# Patient Record
Sex: Male | Born: 1975 | Race: White | Hispanic: No | Marital: Married | Smoking: Current every day smoker
Health system: Southern US, Community
[De-identification: ages and names within clinical notes are randomized; demographics above are authoritative.]

## PROBLEM LIST (undated history)

## (undated) DIAGNOSIS — K5792 Diverticulitis of intestine, part unspecified, without perforation or abscess without bleeding: Secondary | ICD-10-CM

## (undated) HISTORY — PX: COLON SURGERY: SHX602

## (undated) HISTORY — PX: TONSILLECTOMY: SUR1361

---

## 2006-03-23 ENCOUNTER — Emergency Department (HOSPITAL_COMMUNITY): Admission: EM | Admit: 2006-03-23 | Discharge: 2006-03-23 | Payer: Self-pay | Admitting: Emergency Medicine

## 2007-12-31 IMAGING — CT CT PELVIS W/O CM
2 of 3 series · 17 of 46 positions shown, 19 images · IV contrast (agent unspecified)
Comparison: None.

CLINICAL DATA: 30-year-old, with left flank and left lower quadrant pain for 3 weeks.  
ABDOMEN CT WITHOUT CONTRAST:
TECHNIQUE: Multidetector CT imaging of the abdomen was performed following the standard protocol without IV contrast.
TECHNIQUE: Multidetector CT imaging of the pelvis was performed following the standard protocol without IV contrast.

[Series 2: renal stone · axial · 0.76mm/px · z∈[-468,-58]mm · 14 of 96 slices shown, 16 images]
[im 7/96  soft-tissue]
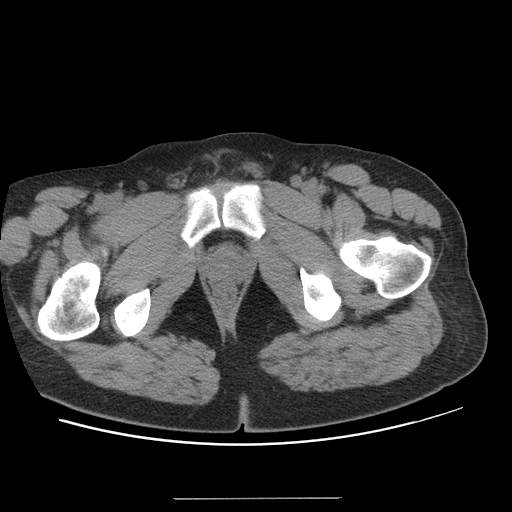
[im 7/96  bone]
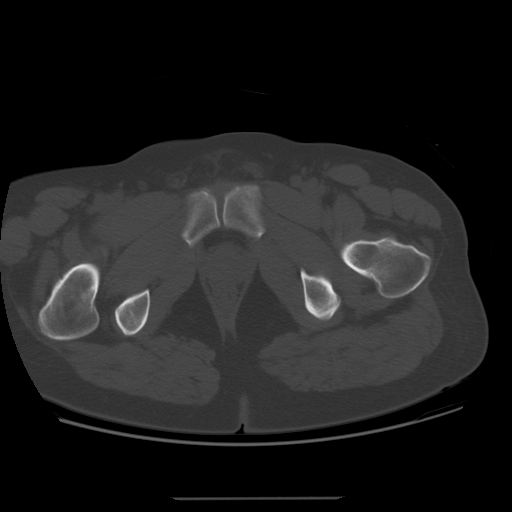
[im 13/96  soft-tissue]
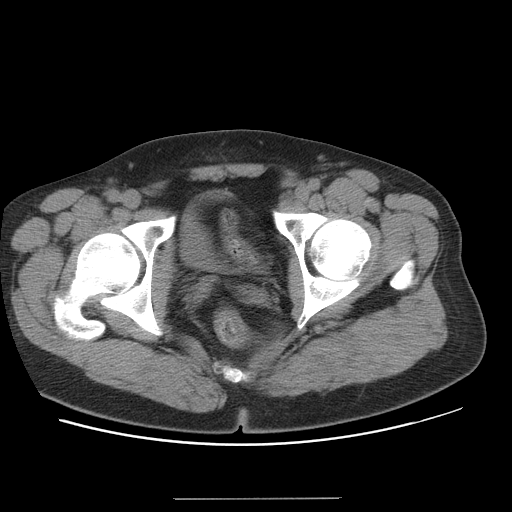
[im 19/96  soft-tissue]
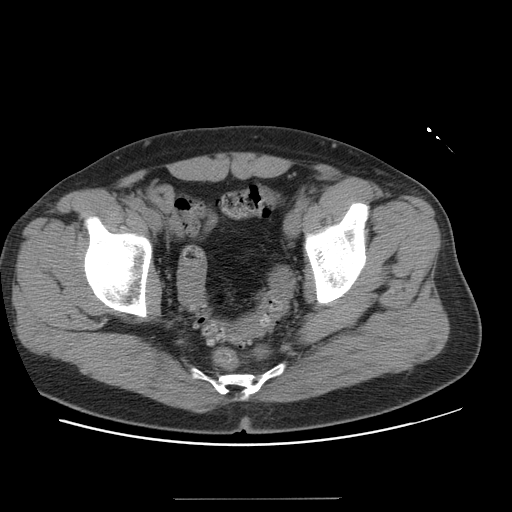
[im 25/96  soft-tissue]
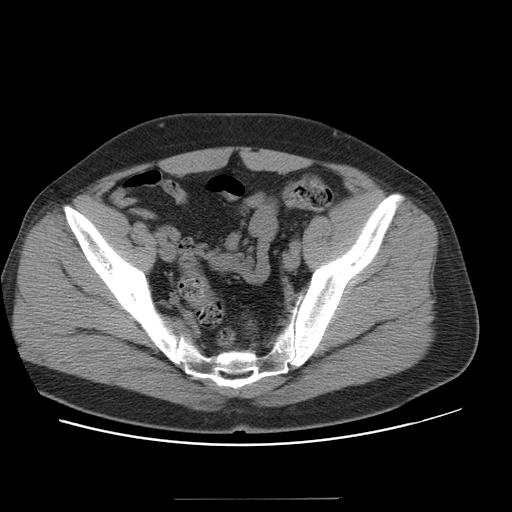
[im 31/96  soft-tissue]
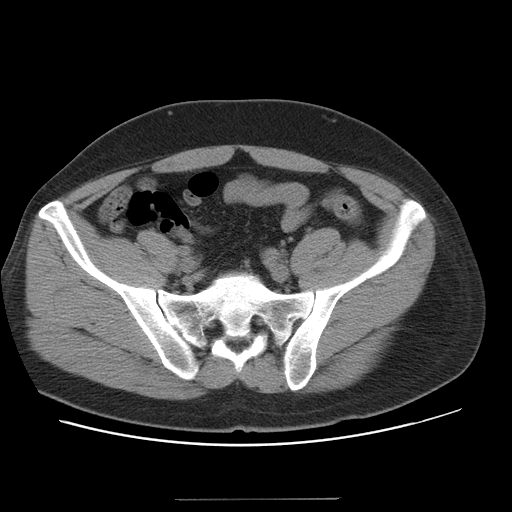
[im 37/96  soft-tissue]
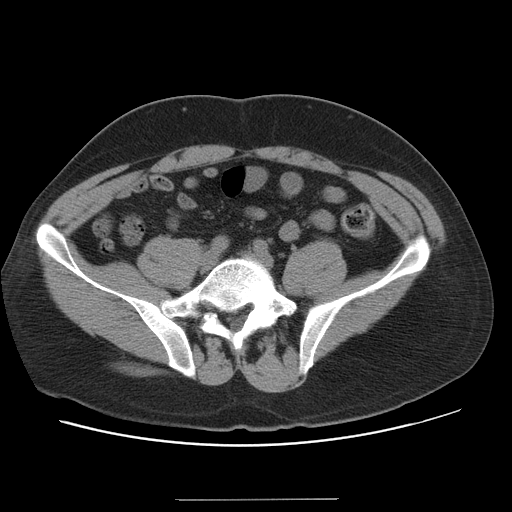
[im 43/96  soft-tissue]
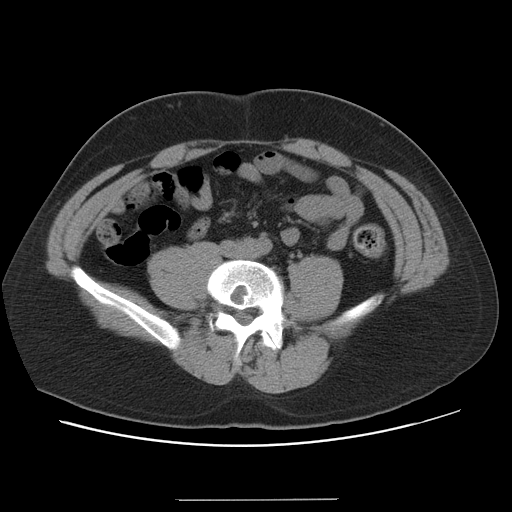
[im 53/96  soft-tissue]
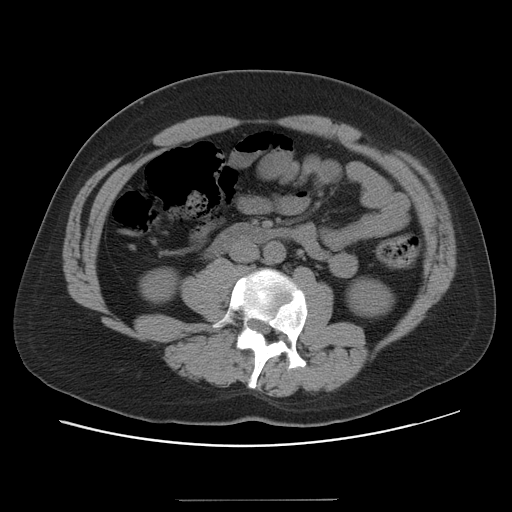
[im 59/96  soft-tissue]
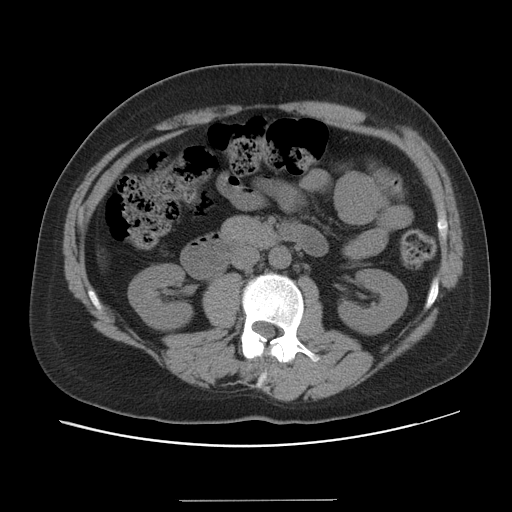
[im 59/96  bone]
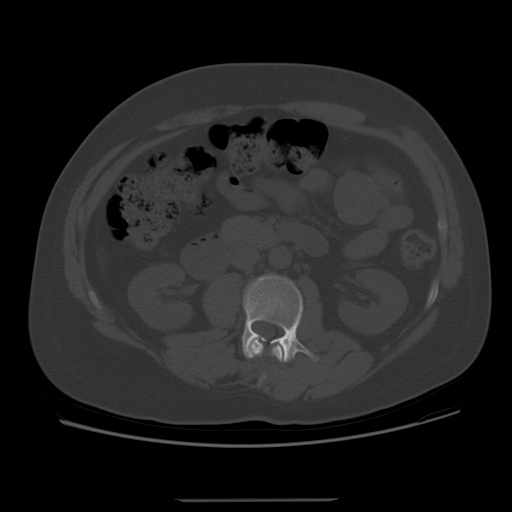
[im 65/96  soft-tissue]
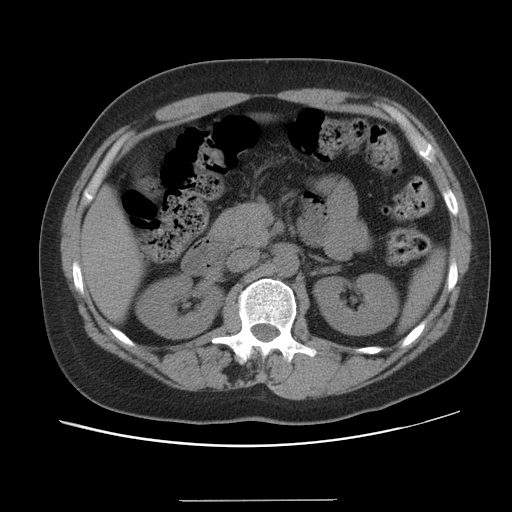
[im 71/96  soft-tissue]
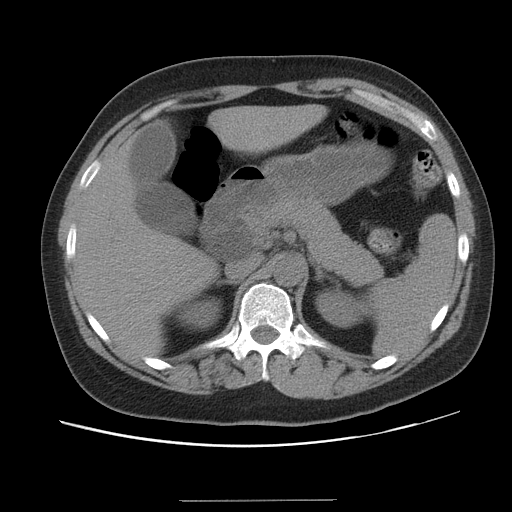
[im 77/96  soft-tissue]
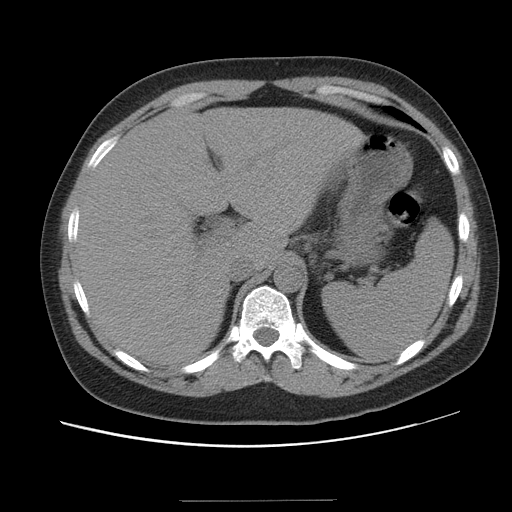
[im 83/96  soft-tissue]
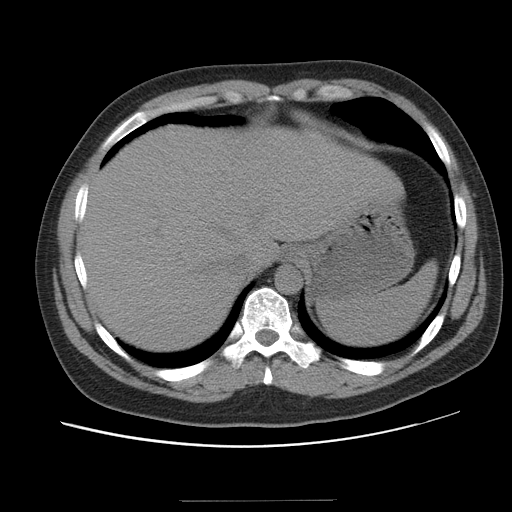
[im 89/96  soft-tissue]
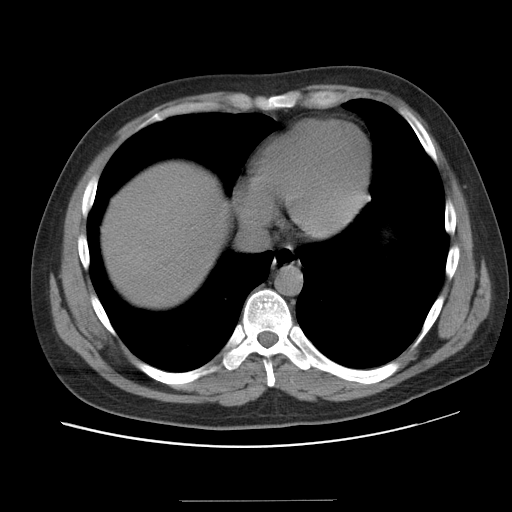

[Series 300: coronal abd · coronal · 0.96mm/px · 3 of 53 slices shown]
[im 18/53  soft-tissue]
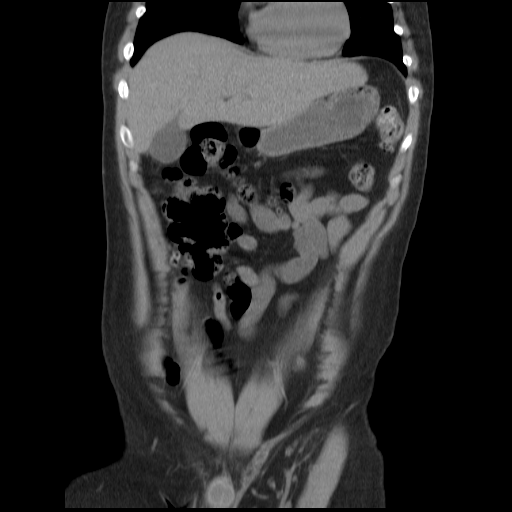
[im 24/53  soft-tissue]
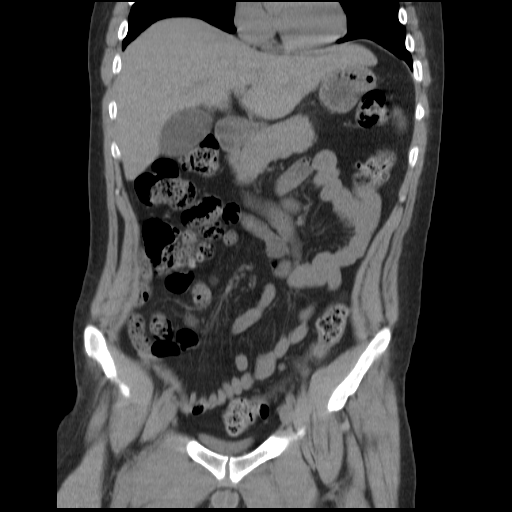
[im 29/53  soft-tissue]
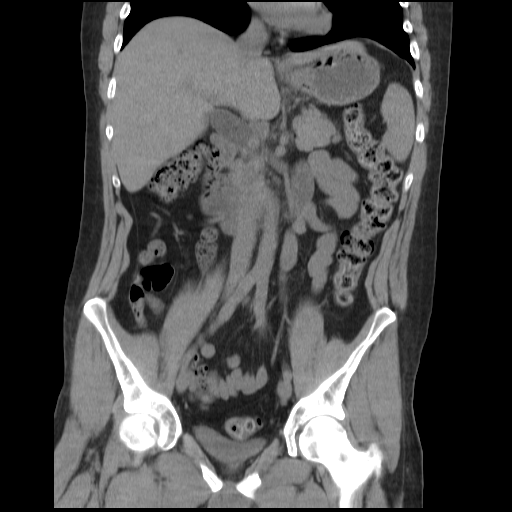

[17 of 46 positions shown; findings below may reference images not displayed]

FINDINGS: Lung bases are clear.  Negative for free air.  Evaluation of the intraabdominal structures is inherently limited without intravenous contrast.  The liver, gallbladder, spleen, pancreas, adrenal glands, stomach, duodenum, and kidneys are within normal limits.  Negative for nephrolithiasis or hydronephrosis.  At least two small accessory spleens are present.  No significant free fluid or lymphadenopathy.  Abnormal appearance of the lumbar spine demonstrated by levoscoliosis.  The patient appears to have a hemivertebra probably involving L-3.  In addition, there is a well circumscribed low density lesion with peripheral sclerosis involving the body of L-4 with probable connection to the superior end plate.  This could represent a large Schmorl?s node although it is indeterminate.
IMPRESSION: 1.  No acute findings in the abdomen.  Negative for hydronephrosis or nephrolithiasis. 
2.  Scoliosis of the lumbar spine with probable hemivertebra and an indeterminate lucent lesion probably involving L-4.  This could represent a very large Schmorl?s node. 
PELVIS CT WITHOUT CONTRAST:
FINDINGS: There are scattered diverticula throughout the sigmoid colon.  It is difficult to exclude a small amount of colonic wall thickening but there is no surrounding inflammation.  No significant free fluid or lymphadenopathy.  The urinary bladder, prostate, and seminal vesicles are within normal limits.  No acute bone abnormalities.
IMPRESSION: Diverticulosis, particularly in the sigmoid colon.  No definite acute inflammatory changes.

## 2009-04-30 ENCOUNTER — Emergency Department (HOSPITAL_COMMUNITY): Admission: EM | Admit: 2009-04-30 | Discharge: 2009-04-30 | Payer: Self-pay | Admitting: Emergency Medicine

## 2011-02-07 IMAGING — CR DG RIBS W/ CHEST 3+V*R*
3 series · 3 of 3 positions shown · non-contrast
Comparison: None.

CLINICAL DATA: Right-sided rib pain, possible lifting injury.

RIGHT RIBS AND CHEST - 3+ VIEW

[w ribs ap/pa upper right]
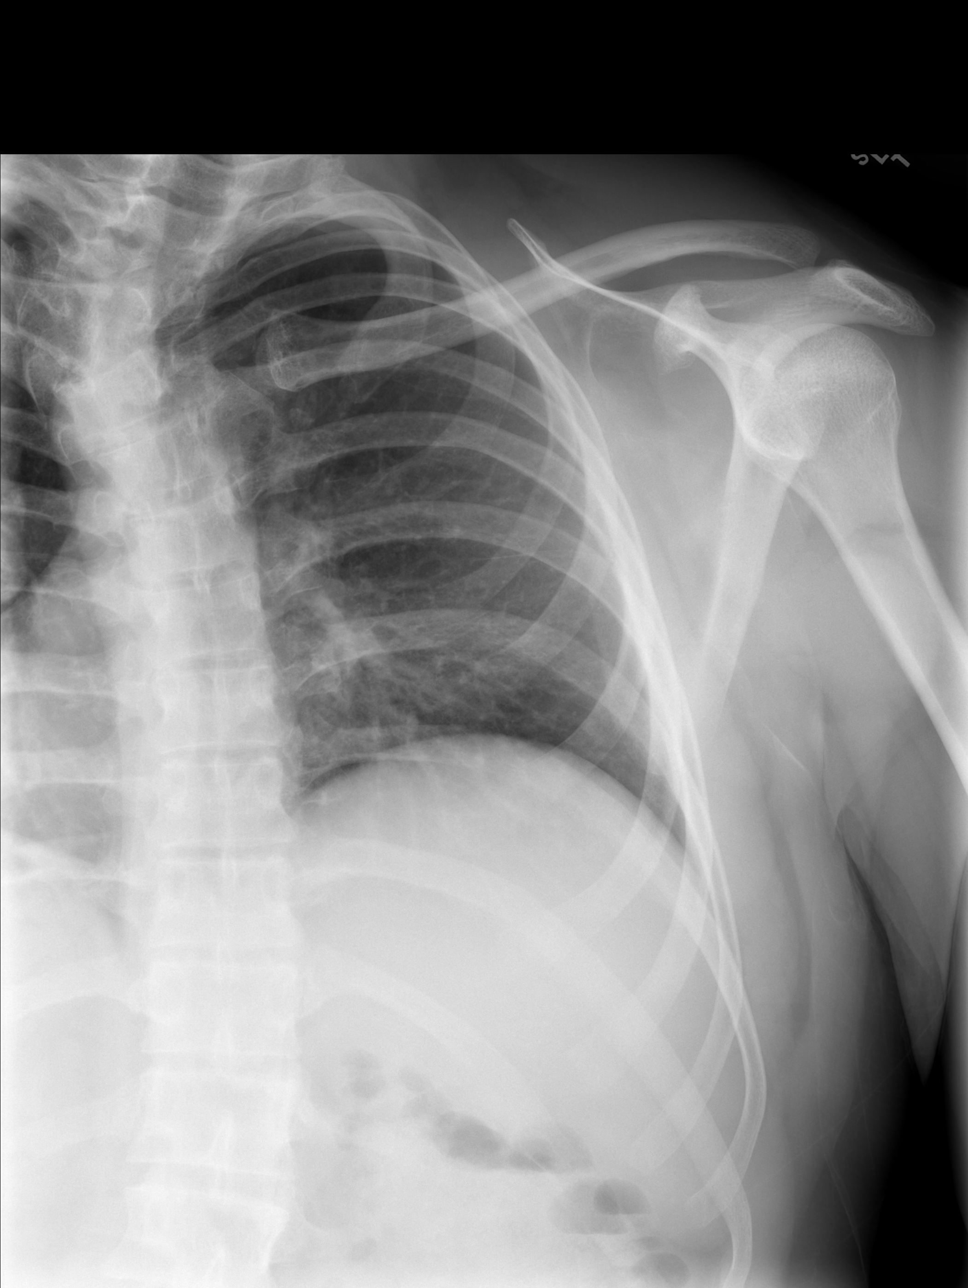

[w ribs oblique right]
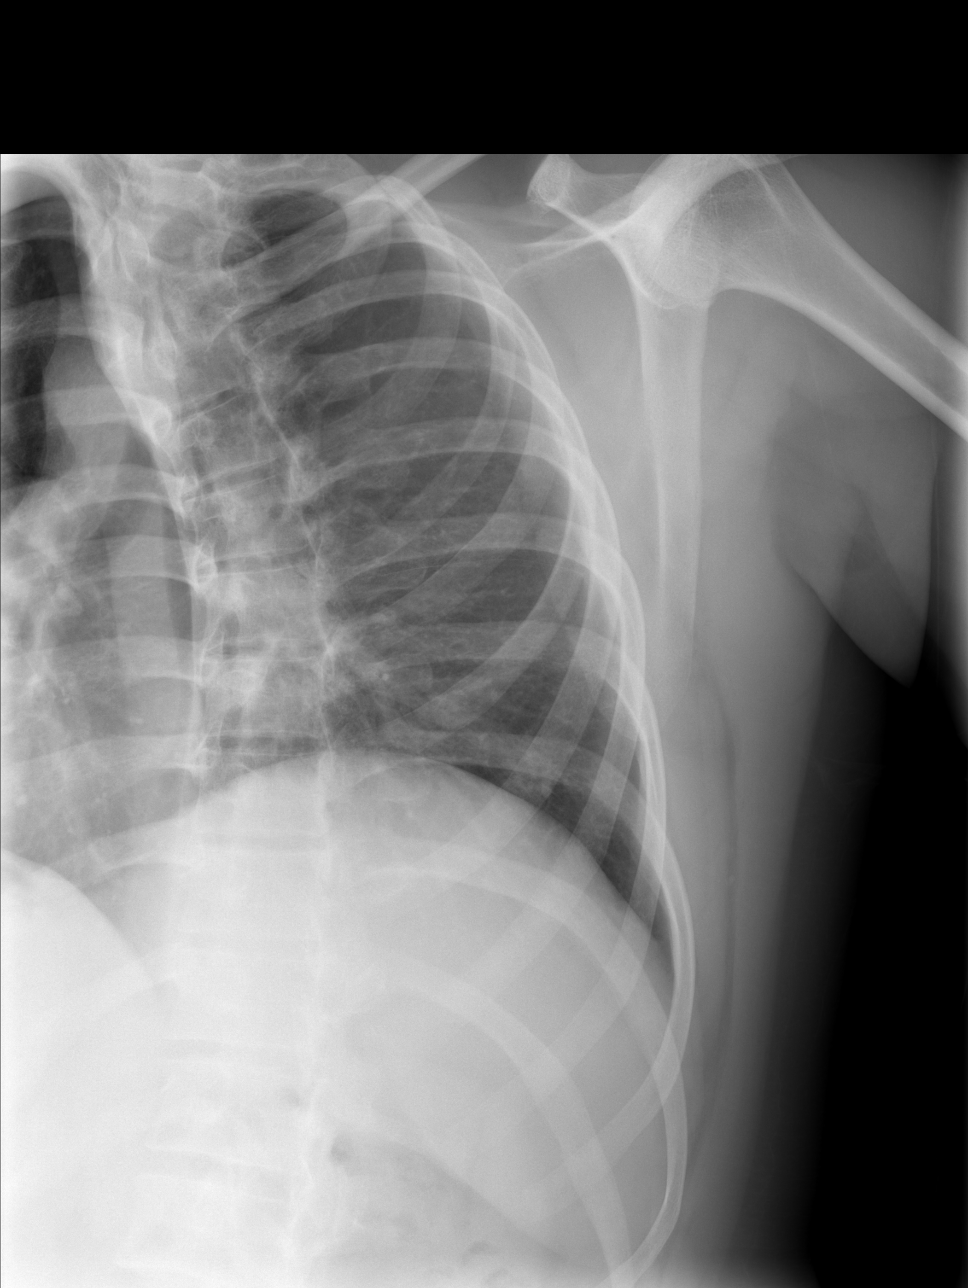

[w ribs ap/pa upper right *]
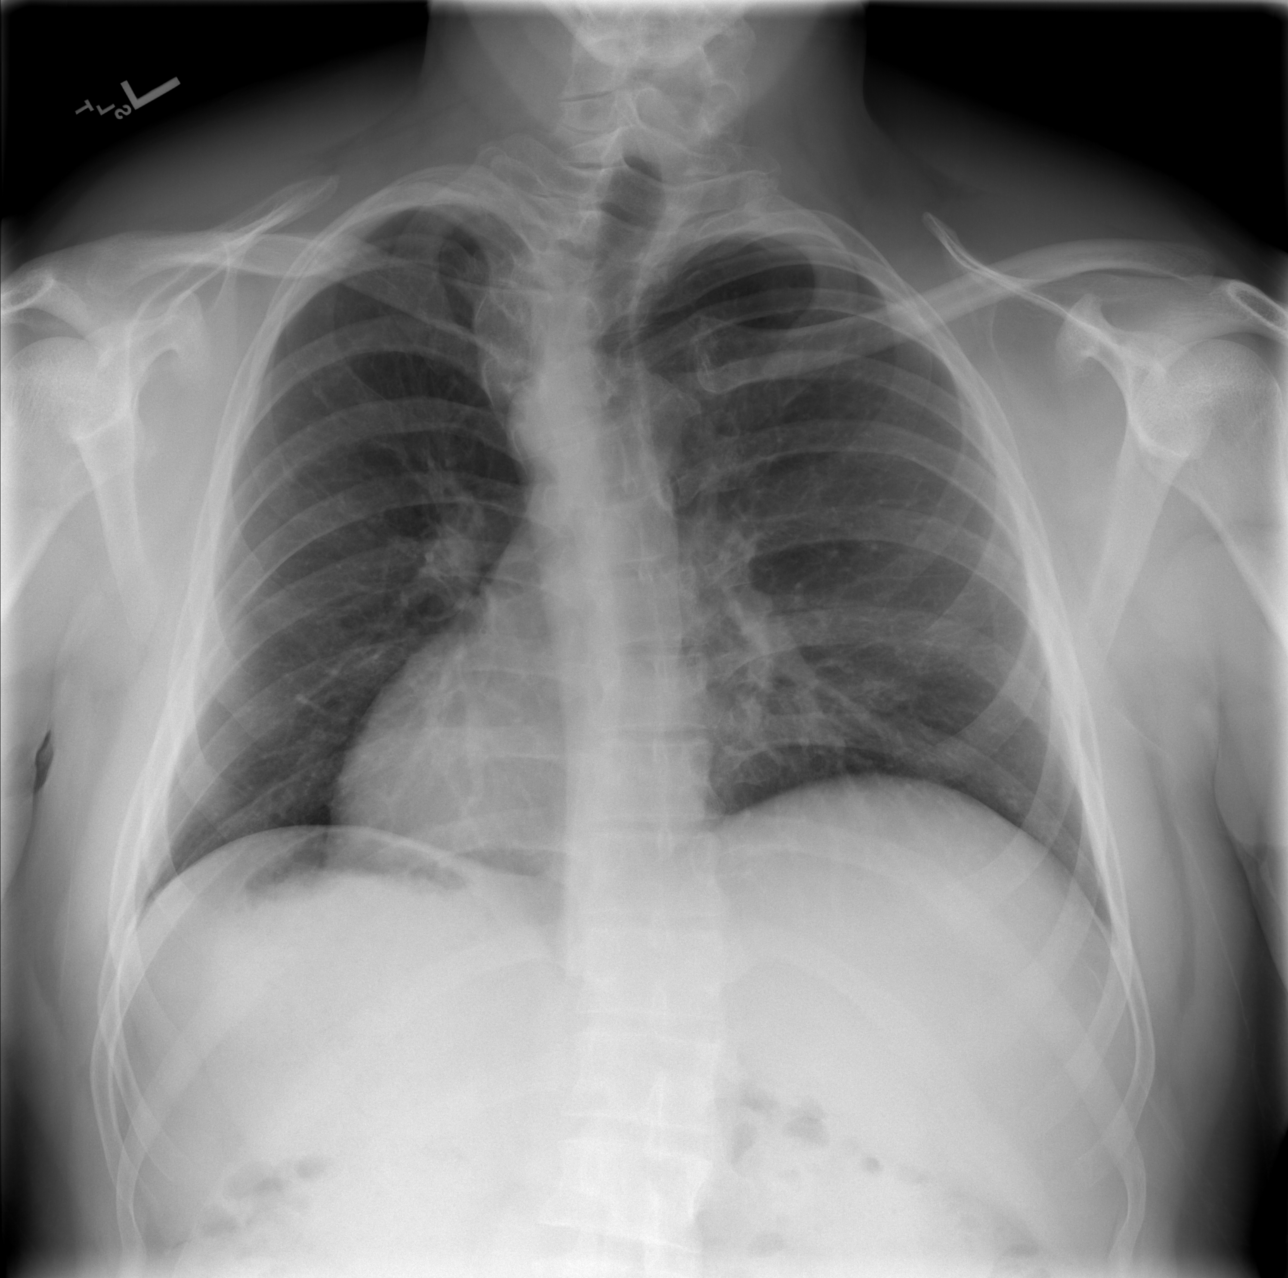

[3 of 3 positions shown; findings below may reference images not displayed]

FINDINGS: Moderate scoliosis convex left in the upper thoracic
region likely relates to a hemivertebra.  There is no rib fracture,
effusion, or pneumothorax.  Lung fields are clear and heart size is
within normal limits.
IMPRESSION: No acute findings.  No visible rib pathology.  Probable upper
thoracic hemivertebra with scoliosis convex left.

## 2018-08-08 ENCOUNTER — Other Ambulatory Visit: Payer: Self-pay

## 2018-08-08 ENCOUNTER — Emergency Department (HOSPITAL_BASED_OUTPATIENT_CLINIC_OR_DEPARTMENT_OTHER)
Admission: EM | Admit: 2018-08-08 | Discharge: 2018-08-08 | Disposition: A | Discharge status: Home / Self Care | Payer: Self-pay | Attending: Emergency Medicine | Admitting: Emergency Medicine

## 2018-08-08 ENCOUNTER — Encounter (HOSPITAL_BASED_OUTPATIENT_CLINIC_OR_DEPARTMENT_OTHER): Payer: Self-pay | Admitting: Emergency Medicine

## 2018-08-08 DIAGNOSIS — Y9269 Other specified industrial and construction area as the place of occurrence of the external cause: Secondary | ICD-10-CM | POA: Insufficient documentation

## 2018-08-08 DIAGNOSIS — W268XXA Contact with other sharp object(s), not elsewhere classified, initial encounter: Secondary | ICD-10-CM | POA: Insufficient documentation

## 2018-08-08 DIAGNOSIS — Y998 Other external cause status: Secondary | ICD-10-CM | POA: Insufficient documentation

## 2018-08-08 DIAGNOSIS — S81811A Laceration without foreign body, right lower leg, initial encounter: Secondary | ICD-10-CM | POA: Insufficient documentation

## 2018-08-08 DIAGNOSIS — Y9389 Activity, other specified: Secondary | ICD-10-CM | POA: Insufficient documentation

## 2018-08-08 DIAGNOSIS — F17200 Nicotine dependence, unspecified, uncomplicated: Secondary | ICD-10-CM | POA: Insufficient documentation

## 2018-08-08 HISTORY — DX: Diverticulitis of intestine, part unspecified, without perforation or abscess without bleeding: K57.92

## 2018-08-08 MED ORDER — LIDOCAINE-EPINEPHRINE 2 %-1:100000 IJ SOLN
20.0000 mL | Freq: Once | INTRAMUSCULAR | Status: DC
  Filled 2018-08-08: qty 20

## 2018-08-08 MED ORDER — LIDOCAINE-EPINEPHRINE 1 %-1:100000 IJ SOLN
INTRAMUSCULAR | Status: AC
  Filled 2018-08-08: qty 1

## 2018-08-08 MED ORDER — LIDOCAINE-EPINEPHRINE 1 %-1:100000 IJ SOLN
INTRAMUSCULAR | Status: AC
  Administered 2018-08-08: 18:00:00
  Filled 2018-08-08: qty 1

## 2018-08-08 NOTE — ED Notes (Signed)
Laceration was cleaned and has saline soaked gauze over the wound.

## 2018-08-08 NOTE — ED Triage Notes (Signed)
Laceration to R shin from a piece of sheet metal.

## 2018-08-08 NOTE — ED Notes (Signed)
ED Provider at bedside. 

## 2018-08-08 NOTE — ED Provider Notes (Signed)
[facility] EMERGENCY DEPARTMENT Provider Note   CSN: [id number] Arrival date & time: 08/08/18  1708    History   Chief Complaint Chief Complaint  Patient presents with  . Leg Injury    HPI Angel Hays is a 43 y.o. male.     HPI   43 year old male presents with concern for right lower leg laceration.  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  He denies any direct trauma to his lower leg.  Reports he does not think that it hit the area, but that it caused the laceration.  Denies possibility of foreign bodies, reports that it was a clean piece of sheet metal.  Reports that the injury occurred about 4 hours ago, he initially completed his job prior to coming into the emergency department for evaluation.  He is up-to-date on his tetanus vaccines.  Denies any other concerns.  Pain is now severe and throbbing.  Past Medical History:  Diagnosis Date  . Diverticulitis     There are no active problems to display for this patient.   Past Surgical History:  Procedure Laterality Date  . COLON SURGERY    . TONSILLECTOMY          Home Medications    Prior to Admission medications   Not on File    Family History No family history on file.  Social History Social History   Tobacco Use  . Smoking status: Current Every Day Smoker  . Smokeless tobacco: Never Used  Substance Use Topics  . Alcohol use: Not Currently  . Drug use: Yes    Types: Marijuana     Allergies   Patient has no known allergies.   Review of Systems Review of Systems  Musculoskeletal: Negative for arthralgias and gait problem.  Skin: Positive for wound.     Physical Exam Updated Vital Signs BP (!) 171/98 (BP Location: Right Arm)   Pulse (!) 109   Temp 98 F (36.7 C) (Oral)   Resp 18   Ht 5\' 9"  (1.753 m)   Wt 86.2 kg   SpO2 98%   BMI 28.06 kg/m    Physical Exam Vitals signs and nursing note reviewed.  Constitutional:      General: He is not in acute distress.    Appearance: He is well-developed. He is not diaphoretic.  HENT:     Head: Normocephalic and atraumatic.  Eyes:     Conjunctiva/sclera: Conjunctivae normal.  Neck:     Musculoskeletal: Normal range of motion.  Cardiovascular:     Rate and Rhythm: Normal rate and regular rhythm.  Pulmonary:     Effort: Pulmonary effort is normal. No respiratory distress.  Skin:    General: Skin is warm and dry.     Comments: 4cm laceration overlying right tibia  Neurological:     Mental Status: He is alert and oriented to person, place, and time.      ED Treatments / Results  Labs (all labs ordered are listed, but only abnormal results are displayed) Labs Reviewed - No data to display  EKG None  Radiology No results found.  Procedures .Marland Kitchen.Laceration Repair Date/Time: 08/08/2018 5:51 PM Performed by: Alvira MondaySchlossman, Leonte Horrigan, MD Authorized by: Alvira MondaySchlossman, Calandria Mullings, MD   Consent:    Consent obtained:  Verbal   Consent given by:  Patient   Risks  discussed:  Infection, pain, need for additional repair, poor cosmetic result and poor wound healing   Alternatives discussed:  No treatment Laceration details:    Location:  Leg   Leg location:  R lower leg   Length (cm):  4   Depth (mm):  2 Repair type:    Repair type:  Intermediate Exploration:    Wound extent: fascia violated   Treatment:    Area cleansed with:  Saline   Amount of cleaning:  Standard   Irrigation solution:  Sterile saline   Irrigation volume:  500 Fascia repair:    Suture size:  4-0   Suture material:  Vicryl   Number of sutures:  2 Subcutaneous repair:    Suture size:  4-0   Suture material:  Vicryl   Number of sutures:  4 Skin repair:    Repair method:  Sutures   Suture size:  4-0   Suture material:  Prolene   Suture technique:  Simple interrupted   Number of sutures:  6 Post-procedure details:     Dressing:  Antibiotic ointment   Patient tolerance of procedure:  Tolerated well, no immediate complications   (including critical care time)  Medications Ordered in ED Medications  lidocaine-EPINEPHrine (XYLOCAINE W/EPI) 2 %-1:100000 (with pres) injection 20 mL (20 mLs Intradermal Not Given 08/08/18 1756)  lidocaine-EPINEPHrine (XYLOCAINE W/EPI) 1 %-1:100000 (with pres) injection (  Given 08/08/18 1756)     Initial Impression / Assessment and Plan / ED Course  I have reviewed the triage vital signs and the nursing notes.  Pertinent labs & imaging results that were available during my care of the patient were reviewed by me and considered in my medical decision making (see chart for details).         43 year old male presents with concern for right lower leg laceration.  Kept working after incident. History not consistent with fracture. No risk of FB per patient and wound explored without any signs of foreign body.  Discussed risks of infection with closure and multilayer closure performed as above.  Recommend suture removal in 10 days.  Patient discharged in stable condition with understanding of reasons to return.    Final Clinical Impressions(s) / ED Diagnoses   Final diagnoses:  Laceration of right lower leg, initial encounter    ED Discharge Orders    None       Alvira Monday, MD 08/08/18 [phone]

## 2018-08-08 NOTE — ED Notes (Signed)
Bacitracin applied to stitches and covered with telfa and kerlex.

## 2018-08-08 NOTE — ED Notes (Signed)
Pt reports tdap has been updated within the last 2 years due to time in prison.

## 2022-01-25 ENCOUNTER — Encounter (HOSPITAL_BASED_OUTPATIENT_CLINIC_OR_DEPARTMENT_OTHER): Payer: Self-pay

## 2022-01-25 ENCOUNTER — Other Ambulatory Visit: Payer: Self-pay

## 2022-01-25 ENCOUNTER — Emergency Department (HOSPITAL_BASED_OUTPATIENT_CLINIC_OR_DEPARTMENT_OTHER)
Admission: EM | Admit: 2022-01-25 | Discharge: 2022-01-26 | Disposition: A | Discharge status: Home / Self Care | Payer: Medicaid Other | Attending: Emergency Medicine | Admitting: Emergency Medicine

## 2022-01-25 DIAGNOSIS — R590 Localized enlarged lymph nodes: Secondary | ICD-10-CM | POA: Diagnosis not present

## 2022-01-25 DIAGNOSIS — N485 Ulcer of penis: Secondary | ICD-10-CM | POA: Diagnosis not present

## 2022-01-25 DIAGNOSIS — N4822 Cellulitis of corpus cavernosum and penis: Secondary | ICD-10-CM | POA: Diagnosis not present

## 2022-01-25 DIAGNOSIS — R1032 Left lower quadrant pain: Secondary | ICD-10-CM | POA: Diagnosis present

## 2022-01-25 LAB — URINALYSIS, ROUTINE W REFLEX MICROSCOPIC
Bilirubin Urine: NEGATIVE
Glucose, UA: NEGATIVE mg/dL
Hgb urine dipstick: NEGATIVE
Ketones, ur: 15 mg/dL — AB
Leukocytes,Ua: NEGATIVE
Nitrite: NEGATIVE
Protein, ur: 30 mg/dL — AB
Specific Gravity, Urine: 1.025 (ref 1.005–1.030)
pH: 6 (ref 5.0–8.0)

## 2022-01-25 LAB — URINALYSIS, MICROSCOPIC (REFLEX)

## 2022-01-25 MED ORDER — OXYCODONE-ACETAMINOPHEN 5-325 MG PO TABS
1.0000 | ORAL_TABLET | Freq: Once | ORAL | Status: AC
  Administered 2022-01-25: 1 via ORAL
  Filled 2022-01-25: qty 1

## 2022-01-25 MED ORDER — CEPHALEXIN 500 MG PO CAPS: 500.0000 mg | ORAL_CAPSULE | Freq: Four times a day (QID) | ORAL | 0 refills | Status: AC

## 2022-01-25 MED ORDER — IBUPROFEN 400 MG PO TABS
600.0000 mg | ORAL_TABLET | Freq: Once | ORAL | Status: AC
  Administered 2022-01-25: 600 mg via ORAL
  Filled 2022-01-25: qty 1

## 2022-01-25 MED ORDER — CEPHALEXIN 250 MG PO CAPS
500.0000 mg | ORAL_CAPSULE | Freq: Once | ORAL | Status: AC
  Administered 2022-01-25: 500 mg via ORAL
  Filled 2022-01-25: qty 2

## 2022-01-25 NOTE — ED Notes (Signed)
Pt ambulatory from triage with staggered gait.

## 2022-01-25 NOTE — Discharge Instructions (Addendum)
You were seen in the emergency department for your groin pain and your penile lesion.  You had no signs of urinary tract infection.  We did test you today for gonorrhea chlamydia.  If this comes back positive you will receive a call and be sent treatment.  The swelling in your groin is due to a swollen lymph node which is likely reactive from the skin infection around your ulcer.  I am given you antibiotics and you should complete this as prescribed.  You should follow-up with your primary doctor and urology as needed.  You should return to the emergency department if you are having fevers despite the antibiotics, spreading redness from your wound, blistering of your skin with fevers, or if you have any other new or concerning symptoms.

## 2022-01-25 NOTE — ED Provider Notes (Incomplete)
[facility] EMERGENCY DEPARTMENT Provider Note   CSN: [id number] Arrival date & time: 01/25/22  1906     History {Add pertinent medical, surgical, social history, OB history to HPI:1} Chief Complaint  Patient presents with  . Testicle Pain  . Wound Check    Angel Hays is a 46 y.o. male.  Patient is a 46 year old male with a past medical history of chronic back pain presenting to the emergency department with groin pain, penile lesion and testicular pain.  The patient states for the past year he has had some discomfort that feels like a pulling type of pain on his left testicle.  He states that it tends to happen after a long day of work and will go away with rest.  He states over the last few months it seems to been happening more frequently when before it would only happen a few times a week.  The patient also reports 2 weeks ago while having sex that he accidentally ripped a small piece of skin off the top of the head of his penis.  He states that there was scabbing to the wound but the scab may have a is fallen off, which continued to be painful.  He states he has had intermittent purulent appearing drainage and some surrounding erythema.  He states over the last day he has had an increasing swelling to his right groin.  He denies any fevers or chills, nausea or vomiting, dysuria hematuria or penile discharge.  He denies any swelling to his testicle.  He states that he was recently tested for HIV and syphilis that were negative but is amenable to testing for gonorrhea and chlamydia.  The history is provided by the patient.  Testicle Pain  Wound Check       Home Medications Prior to Admission medications   Medication Sig Start Date End Date Taking? Authorizing Provider  cephALEXin (KEFLEX) 500 MG capsule Take 1 capsule (500 mg total) by mouth 4 (four) times daily. 01/25/22  Yes Elayne Snare K, DO      Allergies    Meperidine hcl and Morphine    Review of  Systems   Review of Systems  Genitourinary:  Positive for testicular pain.    Physical Exam Updated Vital Signs BP (!) 128/90   Pulse 90   Temp 98.9 F (37.2 C) (Oral)   Resp 18   Ht 5\' 9"  (1.753 m)   Wt 99.8 kg   SpO2 96%   BMI 32.49 kg/m  Physical Exam Vitals and nursing note reviewed. Exam conducted with a chaperone present.  Constitutional:      General: He is not in acute distress.    Appearance: Normal appearance.  HENT:     Head: Normocephalic and atraumatic.     Nose: Nose normal.     Mouth/Throat:     Mouth: Mucous membranes are moist.     Pharynx: Oropharynx is clear.  Eyes:     Extraocular Movements: Extraocular movements intact.     Conjunctiva/sclera: Conjunctivae normal.  Cardiovascular:     Rate and Rhythm: Normal rate and regular rhythm.     Pulses: Normal pulses.     Heart sounds: Normal heart sounds.  Pulmonary:     Effort: Pulmonary effort is normal.     Breath sounds: Normal breath sounds.  Abdominal:     General: Abdomen is flat.     Palpations: Abdomen is soft.     Tenderness: There is no abdominal tenderness. There is  no right CVA tenderness or left CVA tenderness.  Musculoskeletal:     Cervical back: Normal range of motion and neck supple.  Neurological:     Mental Status: He is alert.     ED Results / Procedures / Treatments   Labs (all labs ordered are listed, but only abnormal results are displayed) Labs Reviewed  URINALYSIS, ROUTINE W REFLEX MICROSCOPIC - Abnormal; Notable for the following components:      Result Value   Ketones, ur 15 (*)    Protein, ur 30 (*)    All other components within normal limits  URINALYSIS, MICROSCOPIC (REFLEX) - Abnormal; Notable for the following components:   Bacteria, UA RARE (*)    All other components within normal limits  GC/CHLAMYDIA PROBE AMP ([place]) NOT AT [facility]    EKG None  Radiology No results found.  Procedures Procedures  {Document cardiac monitor, telemetry assessment  procedure when appropriate:1}  Medications Ordered in ED Medications  oxyCODONE-acetaminophen (PERCOCET/ROXICET) 5-325 MG per tablet 1 tablet (1 tablet Oral Given 01/25/22 2352)  ibuprofen (ADVIL) tablet 600 mg (600 mg Oral Given 01/25/22 2352)  cephALEXin (KEFLEX) capsule 500 mg (500 mg Oral Given 01/25/22 2354)    ED Course/ Medical Decision Making/ A&P                           Medical Decision Making Amount and/or Complexity of Data Reviewed Labs: ordered.  Risk Prescription drug management.   ***  {Document critical care time when appropriate:1} {Document review of labs and clinical decision tools ie heart score, Chads2Vasc2 etc:1}  {Document your independent review of radiology images, and any outside records:1} {Document your discussion with family members, caretakers, and with consultants:1} {Document social determinants of health affecting pt's care:1} {Document your decision making why or why not admission, treatments were needed:1} Final Clinical Impression(s) / ED Diagnoses Final diagnoses:  Inguinal lymphadenopathy  Penile ulcer  Cellulitis of penis    Rx / DC Orders ED Discharge Orders          Ordered    cephALEXin (KEFLEX) 500 MG capsule  4 times daily        01/25/22 2352

## 2022-01-25 NOTE — ED Provider Notes (Signed)
[facility] EMERGENCY DEPARTMENT Provider Note   CSN: [id number] Arrival date & time: 01/25/22  1906     History  Chief Complaint  Patient presents with   Testicle Pain   Wound Check    Angel Hays is a 46 y.o. male.  Patient is a 46 year old male with a past medical history of chronic back pain presenting to the emergency department with groin pain, penile lesion and testicular pain.  The patient states for the past year he has had some discomfort that feels like a pulling type of pain on his left testicle.  He states that it tends to happen after a long day of work and will go away with rest.  He states over the last few months it seems to been happening more frequently when before it would only happen a few times a week.  The patient also reports 2 weeks ago while having sex that he accidentally ripped a small piece of skin off the top of the head of his penis.  He states that there was scabbing to the wound but the scab may have a is fallen off, which continued to be painful.  He states he has had intermittent purulent appearing drainage and some surrounding erythema.  He states over the last day he has had an increasing swelling to his right groin.  He denies any fevers or chills, nausea or vomiting, dysuria hematuria or penile discharge.  He denies any swelling to his testicle.  He states that he was recently tested for HIV and syphilis that were negative but is amenable to testing for gonorrhea and chlamydia.  The history is provided by the patient.  Testicle Pain  Wound Check       Home Medications Prior to Admission medications   Medication Sig Start Date End Date Taking? Authorizing Provider  cephALEXin (KEFLEX) 500 MG capsule Take 1 capsule (500 mg total) by mouth 4 (four) times daily. 01/25/22  Yes Elayne Snare K, DO      Allergies    Meperidine hcl and Morphine    Review of Systems   Review of Systems  Genitourinary:  Positive for testicular  pain.    Physical Exam Updated Vital Signs BP (!) 128/90   Pulse 90   Temp 98.9 F (37.2 C) (Oral)   Resp 18   Ht 5\' 9"  (1.753 m)   Wt 99.8 kg   SpO2 96%   BMI 32.49 kg/m  Physical Exam Vitals and nursing note reviewed. Exam conducted with a chaperone present.  Constitutional:      General: He is not in acute distress.    Appearance: Normal appearance.  HENT:     Head: Normocephalic and atraumatic.     Nose: Nose normal.     Mouth/Throat:     Mouth: Mucous membranes are moist.     Pharynx: Oropharynx is clear.  Eyes:     Extraocular Movements: Extraocular movements intact.     Conjunctiva/sclera: Conjunctivae normal.  Cardiovascular:     Rate and Rhythm: Normal rate and regular rhythm.     Pulses: Normal pulses.     Heart sounds: Normal heart sounds.  Pulmonary:     Effort: Pulmonary effort is normal.     Breath sounds: Normal breath sounds.  Abdominal:     General: Abdomen is flat.     Palpations: Abdomen is soft.     Tenderness: There is no abdominal tenderness. There is no right CVA tenderness or left CVA tenderness.  Genitourinary:    Comments: R-side tender inguinal lymphadenopathy No palpable femoral, direct or indirect hernia No testicular tenderness to palpation, no scrotal swelling  No penile discharge ~1 cm ulcer to L side of head of penis with no active draining and surrounding erythema and warmth Musculoskeletal:        General: Normal range of motion.     Cervical back: Normal range of motion and neck supple.  Skin:    General: Skin is warm and dry.  Neurological:     General: No focal deficit present.     Mental Status: He is alert and oriented to person, place, and time.  Psychiatric:        Mood and Affect: Mood normal.        Behavior: Behavior normal.     ED Results / Procedures / Treatments   Labs (all labs ordered are listed, but only abnormal results are displayed) Labs Reviewed  URINALYSIS, ROUTINE W REFLEX MICROSCOPIC - Abnormal;  Notable for the following components:      Result Value   Ketones, ur 15 (*)    Protein, ur 30 (*)    All other components within normal limits  URINALYSIS, MICROSCOPIC (REFLEX) - Abnormal; Notable for the following components:   Bacteria, UA RARE (*)    All other components within normal limits  GC/CHLAMYDIA PROBE AMP ([place]) NOT AT [facility]    EKG None  Radiology No results found.  Procedures Procedures    Medications Ordered in ED Medications  oxyCODONE-acetaminophen (PERCOCET/ROXICET) 5-325 MG per tablet 1 tablet (1 tablet Oral Given 01/25/22 2352)  ibuprofen (ADVIL) tablet 600 mg (600 mg Oral Given 01/25/22 2352)  cephALEXin (KEFLEX) capsule 500 mg (500 mg Oral Given 01/25/22 2354)    ED Course/ Medical Decision Making/ A&P                           Medical Decision Making This patient presents to the ED with chief complaint(s) of inguinal pain, penile lesion, testicular pain with pertinent past medical history of chronic back pain which further complicates the presenting complaint. The complaint involves an extensive differential diagnosis and also carries with it a high risk of complications and morbidity.    The differential diagnosis includes patient has no testicular tenderness or swelling on exam making testicular torsion or epididymitis unlikely, he has no scrotal swelling making hydrocele unlikely, he does have tender lymphadenopathy in the right groin likely causing his pain, no palpable hernias on exam, he does have a ulcerated lesion with surrounding erythema and warmth, concerning for cellulitis.  Patient states that this was a traumatic injury and is less likely to be a syphilis lesion and does not want to be tested for syphilis today.  Additional history obtained: Additional history obtained from N/A Records reviewed N/A  ED Course and Reassessment: Patient was initially evaluated by triage and had urine performed that was negative for UTI.  He is  agreeable for testing for gonorrhea and chlamydia but declines testing for HIV or syphilis today.  He has evidence of cellulitis and likely reactive lymph node secondary to the cellulitis and will be treated with Keflex.  He will be given outpatient primary care and urology follow-up and was given strict return precautions  Independent labs interpretation:  The following labs were independently interpreted: UA negative for UTI  Independent visualization of imaging: N/A  Consultation: - Consulted or discussed management/test interpretation w/ external professional: N/A  Consideration for admission or further workup: Patient has no emergent conditions requiring admission or further work-up at this time and is stable for discharge with outpatient follow-up Social Determinants of health: N/A    Amount and/or Complexity of Data Reviewed Labs: ordered.  Risk Prescription drug management.          Final Clinical Impression(s) / ED Diagnoses Final diagnoses:  Inguinal lymphadenopathy  Penile ulcer  Cellulitis of penis    Rx / DC Orders ED Discharge Orders          Ordered    cephALEXin (KEFLEX) 500 MG capsule  4 times daily        01/25/22 2352              Elayne Snare K, DO 01/26/22 0007

## 2022-01-25 NOTE — ED Notes (Signed)
Lab notified to add-on GC/chlamydia lab to previously collected urine sample.

## 2022-01-25 NOTE — ED Triage Notes (Signed)
Pt reports intermittent left testicle pain x 1 year. Pain only returns after working all day.   Also admits to an injury on the penis 2 weeks ago. Scab fell off and is not healing.

## 2022-01-26 NOTE — ED Notes (Signed)
D/c paperwork reviewed with pt incl prescriptions and f/u care. Pt verbalized understanding, no questions or concerns at time of d/c. Pt ambulatory to ED exit, NAD.

## 2022-01-27 LAB — GC/CHLAMYDIA PROBE AMP (~~LOC~~) NOT AT ARMC
Chlamydia: NEGATIVE
Comment: NEGATIVE
Comment: NORMAL
Neisseria Gonorrhea: NEGATIVE
